# Patient Record
Sex: Female | Born: 1955 | Race: White | Hispanic: No | Marital: Married | Smoking: Never smoker
Health system: Southern US, Community
[De-identification: ages and names within clinical notes are randomized; demographics above are authoritative.]

## PROBLEM LIST (undated history)

## (undated) HISTORY — PX: TOTAL LAPAROSCOPIC HYSTERECTOMY WITH SALPINGECTOMY: SHX6742

---
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
## 1981-05-19 HISTORY — PX: OVARIAN CYST REMOVAL: SHX89

## 1989-11-19 HISTORY — PX: BREAST ENHANCEMENT SURGERY: SHX7

## 2013-06-16 ENCOUNTER — Ambulatory Visit: Payer: Self-pay | Admitting: Sports Medicine

## 2013-06-22 ENCOUNTER — Encounter: Payer: Self-pay | Admitting: Sports Medicine

## 2013-06-22 ENCOUNTER — Ambulatory Visit (INDEPENDENT_AMBULATORY_CARE_PROVIDER_SITE_OTHER): Payer: BC Managed Care – PPO

## 2013-06-22 ENCOUNTER — Ambulatory Visit (INDEPENDENT_AMBULATORY_CARE_PROVIDER_SITE_OTHER): Payer: BC Managed Care – PPO | Admitting: Sports Medicine

## 2013-06-22 VITALS — BP 118/72 | HR 98 | Wt 137.0 lb

## 2013-06-22 DIAGNOSIS — G5600 Carpal tunnel syndrome, unspecified upper limb: Secondary | ICD-10-CM

## 2013-06-22 DIAGNOSIS — M5412 Radiculopathy, cervical region: Secondary | ICD-10-CM

## 2013-06-22 DIAGNOSIS — G5601 Carpal tunnel syndrome, right upper limb: Secondary | ICD-10-CM | POA: Insufficient documentation

## 2013-06-22 MED ORDER — DEXAMETHASONE 2 MG PO TABS: 2.0000 mg | ORAL_TABLET | Freq: Two times a day (BID) | ORAL | Status: DC

## 2013-06-22 MED ORDER — MELOXICAM 15 MG PO TABS: ORAL_TABLET | ORAL | Status: DC

## 2013-06-22 NOTE — Assessment & Plan Note (Signed)
Median nerve hydrodissection as above. Continue night splints, home exercises. Mobic.

## 2013-06-22 NOTE — Progress Notes (Signed)
   Subjective:    I'm seeing this patient as a consultation for:  Dr. Lelon Perla  CC: Wrist Pain  HPI: Kathleen Reilly is a pleasant 57 year old female who presents with complaint of right wrist pain. The patient states that she has experienced some paresthesias and pain in her right wrist for a couple of years. She noticed the pain more in the morning upon waking and anytime she did her hair or had her hands above her heart for extended periods of time. She states that this pain has been intermittent but that 3 weeks ago, after she painted a room, the pain increased. She states that the pain is 7/10 and that can arise without movement of the wrist. The pain can either be electrical or so numbing that it hurts. The pain keeps her up at night and "prevents her from doing things she needs to do." She has tried night splinting but states that it caused so much pain that she stopped. She endorses chronic neck pain.  Past medical history, Surgical history, Family history not pertinant except as noted below, Social history, Allergies, and medications have been entered into the medical record, reviewed, and no changes needed.   Review of Systems: No headache, visual changes, nausea, vomiting, diarrhea, constipation, dizziness, abdominal pain, skin rash, fevers, chills, night sweats, weight loss, swollen lymph nodes, body aches, joint swelling, muscle aches, chest pain, shortness of breath, mood changes, visual or auditory hallucinations.   Objective:   General: Well Developed, well nourished, and in no acute distress.  Neuro/Psych: Alert and oriented x3, extra-ocular muscles intact, able to move all 4 extremities, sensation grossly intact. Skin: Warm and dry, no rashes noted.  Respiratory: Not using accessory muscles, speaking in full sentences, trachea midline.  Cardiovascular: Pulses palpable, no extremity edema. Abdomen: Does not appear distended. Right wrist: Positive Phalen's test, thenar tenderness to  palpation. Tinel's test elicits paresthesias in fingers 2, 3, and 4. No thenar atrophy or hyperthenar atrophy. Neck:  Positive Spurling's test, reproduces pain radiating down the right arm in a C8 distribution.  X-rays were personally reviewed and shows C5-C6 as well as C6-C7 degenerative disc disease with multiple osteophytes and some evidence of foraminal osteophytosis.  Procedure: Real-time Ultrasound Guided injection/hydrodissection of right median nerve Device: GE Logiq E  Verbal informed consent obtained.  Time-out conducted.  Noted no overlying erythema, induration, or other signs of local infection.  Skin prepped in a sterile fashion.  Local anesthesia: Topical Ethyl chloride.  With sterile technique and under real time ultrasound guidance:  Needle advanced near the nerve, a total of 1 cc Kenalog 40, 4 cc lidocaine injected both superficial and deep to the median nerve slowly peeling it off of surrounding structures. Completed without difficulty  Pain immediately resolved suggesting accurate placement of the medication.  Advised to call if fevers/chills, erythema, induration, drainage, or persistent bleeding.  Images permanently stored and available for review in the ultrasound unit.  Impression: Technically successful ultrasound guided median nerve injection/hydrodissection.  Impression and Recommendations:   This case required medical decision making of moderate complexity.  Carpal Tunnel Syndrome and Cervical Spine Radiculitis Median nerve injection Obtaining cervical spine x-ray Prescribing prednisone, mobic, home exercises Follow up in 1 month

## 2013-06-22 NOTE — Assessment & Plan Note (Signed)
I think that her radicular symptoms represent a separate issue from the carpal tunnel syndrome, she describes more of a C8 versus C7 radiculitis. Decadron, x-rays, home exercises. Return in 4 weeks.

## 2013-07-23 ENCOUNTER — Telehealth: Payer: Self-pay | Admitting: *Deleted

## 2013-07-23 ENCOUNTER — Ambulatory Visit (INDEPENDENT_AMBULATORY_CARE_PROVIDER_SITE_OTHER): Payer: BC Managed Care – PPO | Admitting: Sports Medicine

## 2013-07-23 ENCOUNTER — Encounter: Payer: Self-pay | Admitting: Sports Medicine

## 2013-07-23 VITALS — BP 116/78 | HR 88 | Wt 138.0 lb

## 2013-07-23 DIAGNOSIS — G5601 Carpal tunnel syndrome, right upper limb: Secondary | ICD-10-CM

## 2013-07-23 DIAGNOSIS — M5412 Radiculopathy, cervical region: Secondary | ICD-10-CM

## 2013-07-23 MED ORDER — DIAZEPAM 5 MG PO TABS: ORAL_TABLET | ORAL | Status: DC

## 2013-07-23 NOTE — Assessment & Plan Note (Signed)
Symptoms are completely resolved after median nerve hydrodissection.

## 2013-07-23 NOTE — Progress Notes (Signed)
  Subjective:    CC: Follow up  HPI: Carpal tunnel syndrome: Status post median nerve hydrodissection at the last visit on the right side, she is now completely pain and numbness free.  Cervical radiculopathy, right side: Partial response to Decadron and home exercises. She does desire to pursue further interventional treatment. She continues to have pain that runs down to her fourth and fifth fingers.  Past medical history, Surgical history, Family history not pertinant except as noted below, Social history, Allergies, and medications have been entered into the medical record, reviewed, and no changes needed.   Review of Systems: No fevers, chills, night sweats, weight loss, chest pain, or shortness of breath.   Objective:    General: Well Developed, well nourished, and in no acute distress.  Neuro: Alert and oriented x3, extra-ocular muscles intact, sensation grossly intact.  HEENT: Normocephalic, atraumatic, pupils equal round reactive to light, neck supple, no masses, no lymphadenopathy, thyroid nonpalpable.  Skin: Warm and dry, no rashes. Cardiac: Regular rate and rhythm, no murmurs rubs or gallops, no lower extremity edema.  Respiratory: Clear to auscultation bilaterally. Not using accessory muscles, speaking in full sentences.  Impression and Recommendations:

## 2013-07-23 NOTE — Telephone Encounter (Signed)
Prior auth obtained for MRI Cervical Spine w/o contrast through [facility] online.  Auth # [id number].  [facility] notified. Barry Dienes, LPN

## 2013-07-23 NOTE — Assessment & Plan Note (Signed)
Symptoms continue, and they likely represent a C8 radiculopathy. X-rays do show multilevel degenerative disease, at this point she has failed conservative measures and I am going to obtain an MRI for interventional injection planning. She will need Valium for preprocedural anxiolysis.

## 2013-08-12 ENCOUNTER — Telehealth: Payer: Self-pay | Admitting: *Deleted

## 2013-08-12 NOTE — Telephone Encounter (Signed)
Myriam Jacobson from imaging called and states pt called and said she was not going to have the MRI due to financial reasons.  Meyer Cory, LPN

## 2013-09-29 ENCOUNTER — Encounter: Payer: Self-pay | Admitting: Sports Medicine

## 2013-09-29 ENCOUNTER — Ambulatory Visit (INDEPENDENT_AMBULATORY_CARE_PROVIDER_SITE_OTHER): Payer: BC Managed Care – PPO | Admitting: Sports Medicine

## 2013-09-29 VITALS — BP 118/72 | HR 100 | Wt 145.0 lb

## 2013-09-29 DIAGNOSIS — M5412 Radiculopathy, cervical region: Secondary | ICD-10-CM

## 2013-09-29 DIAGNOSIS — G5600 Carpal tunnel syndrome, unspecified upper limb: Secondary | ICD-10-CM

## 2013-09-29 DIAGNOSIS — G5601 Carpal tunnel syndrome, right upper limb: Secondary | ICD-10-CM

## 2013-09-29 NOTE — Assessment & Plan Note (Signed)
Injection performed today. Night splints. Return as needed, we can do this safely up to 4 times a year/every 3 months.

## 2013-09-29 NOTE — Progress Notes (Addendum)
  Subjective:    CC: Followup  HPI: Right carpal tunnel syndrome: Injected approximately 3 months ago, excellent response in the last week. She desires repeat interventional treatment. Worsening. Gets numbness and tingling into her first and second fingers, pain into her forearm.  Cervical radiculopathy: at this point cannot afford the deductible for an MRI, she will let us know when she is ready to proceed with this.  Past medical history, Surgical history, Family history not pertinant except as noted below, Social history, Allergies, and medications have been entered into the medical record, reviewed, and no changes needed.   Review of Systems: No fevers, chills, night sweats, weight loss, chest pain, or shortness of breath.   Objective:    General: Well Developed, well nourished, and in no acute distress.  Neuro: Alert and oriented x3, extra-ocular muscles intact, sensation grossly intact.  HEENT: Normocephalic, atraumatic, pupils equal round reactive to light, neck supple, no masses, no lymphadenopathy, thyroid nonpalpable.  Skin: Warm and dry, no rashes. Cardiac: Regular rate and rhythm, no murmurs rubs or gallops, no lower extremity edema.  Respiratory: Clear to auscultation bilaterally. Not using accessory muscles, speaking in full sentences. Right Wrist: Inspection normal with no visible erythema or swelling. ROM smooth and normal with good flexion and extension and ulnar/radial deviation that is symmetrical with opposite wrist. Palpation is normal over metacarpals, navicular, lunate, and TFCC; tendons without tenderness/ swelling No snuffbox tenderness. No tenderness over Canal of Guyon. Strength 5/5 in all directions without pain. Negative Finkelstein sign, positive Tinel's, positive Phalen signs. Negative Watson's test.  Procedure: Real-time Ultrasound Guided injection/Hydrodissection of right carpal tunnel and median nerve. Device: GE Logiq E  Verbal informed consent  obtained.  Time-out conducted.  Noted no overlying erythema, induration, or other signs of local infection.  Skin prepped in a sterile fashion.  Local anesthesia: Topical Ethyl chloride.  With sterile technique and under real time ultrasound guidance:  25-gauge needle advanced into the carpal tunnel taking care to avoid all vascular structures, a total of 1 cc Kenalog 40, 3 cc lidocaine was used to hydrodissect the median nerve surrounding structures. Completed without difficulty  Pain immediately resolved suggesting accurate placement of the medication.  Advised to call if fevers/chills, erythema, induration, drainage, or persistent bleeding.  Images permanently stored and available for review in the ultrasound unit.  Impression: Technically successful ultrasound guided injection.  Impression and Recommendations:

## 2013-09-29 NOTE — Assessment & Plan Note (Signed)
We can keep this on the back burner for now. Next would be an MRI for interventional injection planning.

## 2014-04-30 ENCOUNTER — Ambulatory Visit (INDEPENDENT_AMBULATORY_CARE_PROVIDER_SITE_OTHER): Payer: BC Managed Care – PPO | Admitting: Sports Medicine

## 2014-04-30 ENCOUNTER — Encounter: Payer: Self-pay | Admitting: Sports Medicine

## 2014-04-30 VITALS — BP 119/74 | HR 69 | Ht 63.0 in | Wt 141.0 lb

## 2014-04-30 DIAGNOSIS — G5600 Carpal tunnel syndrome, unspecified upper limb: Secondary | ICD-10-CM

## 2014-04-30 DIAGNOSIS — G5601 Carpal tunnel syndrome, right upper limb: Secondary | ICD-10-CM

## 2014-04-30 NOTE — Assessment & Plan Note (Signed)
Seven-month response to the last median nerve hydrodissection. Repeat injection/hydrodissection today. Return as needed.

## 2014-04-30 NOTE — Progress Notes (Signed)
  Subjective:    CC: Followup  HPI: Right carpal tunnel syndrome: Solid seven-month response to the last median nerve hydrodissection She desires repeat interventional treatment. Worsening. Gets numbness and tingling into her first and second fingers, pain into her forearm.  Past medical history, Surgical history, Family history not pertinant except as noted below, Social history, Allergies, and medications have been entered into the medical record, reviewed, and no changes needed.   Review of Systems: No fevers, chills, night sweats, weight loss, chest pain, or shortness of breath.   Objective:    General: Well Developed, well nourished, and in no acute distress.  Neuro: Alert and oriented x3, extra-ocular muscles intact, sensation grossly intact.  HEENT: Normocephalic, atraumatic, pupils equal round reactive to light, neck supple, no masses, no lymphadenopathy, thyroid nonpalpable.  Skin: Warm and dry, no rashes. Cardiac: Regular rate and rhythm, no murmurs rubs or gallops, no lower extremity edema.  Respiratory: Clear to auscultation bilaterally. Not using accessory muscles, speaking in full sentences. Right Wrist: Inspection normal with no visible erythema or swelling. ROM smooth and normal with good flexion and extension and ulnar/radial deviation that is symmetrical with opposite wrist. Palpation is normal over metacarpals, navicular, lunate, and TFCC; tendons without tenderness/ swelling No snuffbox tenderness. No tenderness over Canal of Guyon. Strength 5/5 in all directions without pain. Negative Finkelstein sign, positive Tinel's, positive Phalen signs. Negative Watson's test.  Procedure: Real-time Ultrasound Guided injection/Hydrodissection of right carpal tunnel and median nerve. Device: GE Logiq E  Verbal informed consent obtained.  Time-out conducted.  Noted no overlying erythema, induration, or other signs of local infection.  Skin prepped in a sterile fashion.    Local anesthesia: Topical Ethyl chloride.  With sterile technique and under real time ultrasound guidance:  25-gauge needle advanced into the carpal tunnel taking care to avoid all vascular structures, a total of 1 cc Kenalog 40, 3 cc lidocaine was used to hydrodissect the median nerve surrounding structures. Completed without difficulty  Pain immediately resolved suggesting accurate placement of the medication.  Advised to call if fevers/chills, erythema, induration, drainage, or persistent bleeding.  Images permanently stored and available for review in the ultrasound unit.  Impression: Technically successful ultrasound guided injection.  Impression and Recommendations:

## 2014-09-29 ENCOUNTER — Ambulatory Visit (INDEPENDENT_AMBULATORY_CARE_PROVIDER_SITE_OTHER): Payer: BC Managed Care – PPO | Admitting: Sports Medicine

## 2014-09-29 ENCOUNTER — Encounter: Payer: Self-pay | Admitting: Sports Medicine

## 2014-09-29 VITALS — BP 110/69 | HR 75 | Ht 63.0 in | Wt 138.0 lb

## 2014-09-29 DIAGNOSIS — G5601 Carpal tunnel syndrome, right upper limb: Secondary | ICD-10-CM | POA: Diagnosis not present

## 2014-09-29 NOTE — Assessment & Plan Note (Signed)
Five-month response to the last median nerve hydrodissection. Repeat median nerve hydrodissection today. Return as needed. Continue extension splinting at nighttime.

## 2014-09-29 NOTE — Progress Notes (Signed)
  Subjective:    CC:  Carpal tunnel syndrome  HPI: Right carpal tunnel syndrome: Five-month response to the last median nerve hydrodissection. His eyes a repeat, pain is moderate, persistent into the hands and median nerve distribution.  Past medical history, Surgical history, Family history not pertinant except as noted below, Social history, Allergies, and medications have been entered into the medical record, reviewed, and no changes needed.   Review of Systems: No fevers, chills, night sweats, weight loss, chest pain, or shortness of breath.   Objective:    General: Well Developed, well nourished, and in no acute distress.  Neuro: Alert and oriented x3, extra-ocular muscles intact, sensation grossly intact.  HEENT: Normocephalic, atraumatic, pupils equal round reactive to light, neck supple, no masses, no lymphadenopathy, thyroid nonpalpable.  Skin: Warm and dry, no rashes. Cardiac: Regular rate and rhythm, no murmurs rubs or gallops, no lower extremity edema.  Respiratory: Clear to auscultation bilaterally. Not using accessory muscles, speaking in full sentences.  Procedure: Real-time Ultrasound Guided injection/Hydrodissection of right carpal tunnel and median nerve. Device: GE Logiq E  Verbal informed consent obtained.  Time-out conducted.  Noted no overlying erythema, induration, or other signs of local infection.  Skin prepped in a sterile fashion.  Local anesthesia: Topical Ethyl chloride.  With sterile technique and under real time ultrasound guidance:  25-gauge needle advanced into the carpal tunnel taking care to avoid all vascular structures, a total of 1 cc Kenalog 40, 3 cc lidocaine was used to hydrodissect the median nerve surrounding structures. Completed without difficulty  Pain immediately resolved suggesting accurate placement of the medication.  Advised to call if fevers/chills, erythema, induration, drainage, or persistent bleeding.  Images permanently stored and  available for review in the ultrasound unit.  Impression: Technically successful ultrasound guided injection.  Impression and Recommendations:

## 2015-02-04 IMAGING — CR DG CERVICAL SPINE COMPLETE 4+V
7 series · 7 of 7 positions shown · non-contrast
Comparison: None.

CLINICAL DATA: Diffuse neck pain, tingling in the right arm, no
injury

CERVICAL SPINE - COMPLETE 4+ VIEW

[view not recorded (1 of 7)]
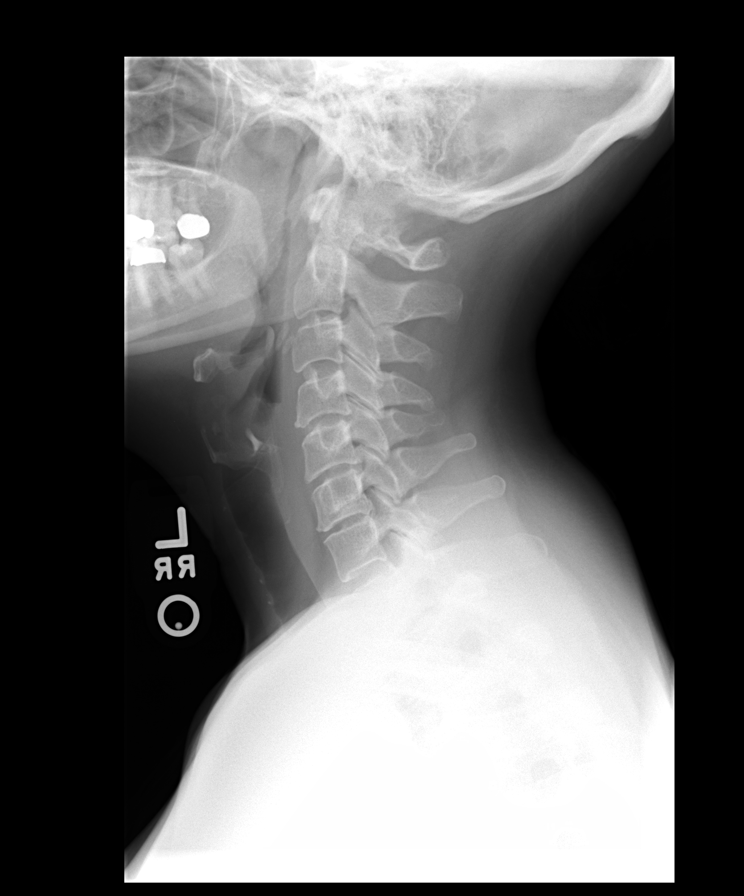

[view not recorded (2 of 7)]
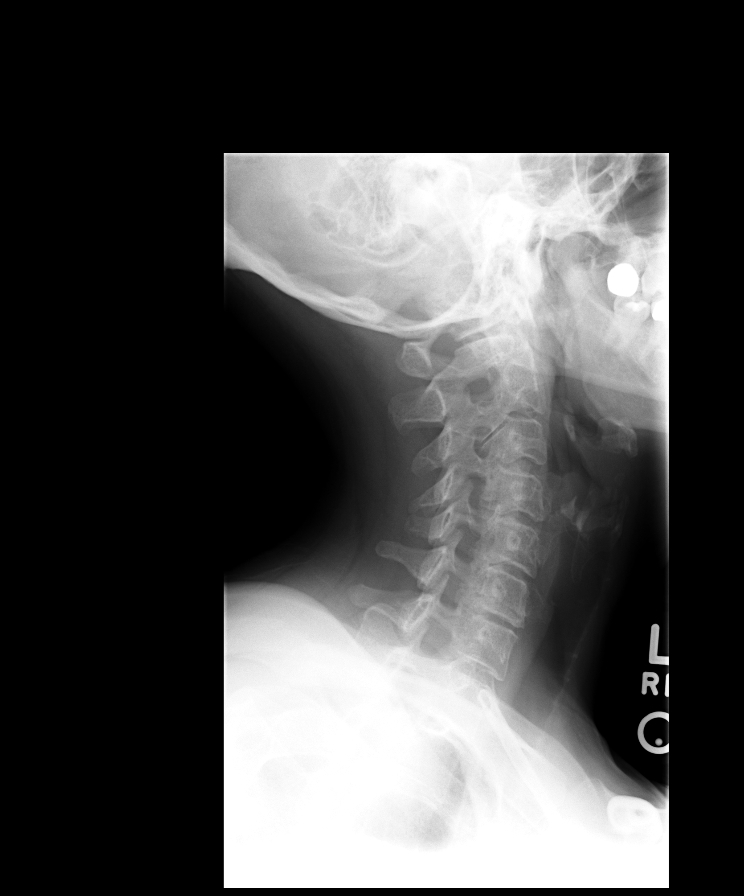

[view not recorded (3 of 7)]
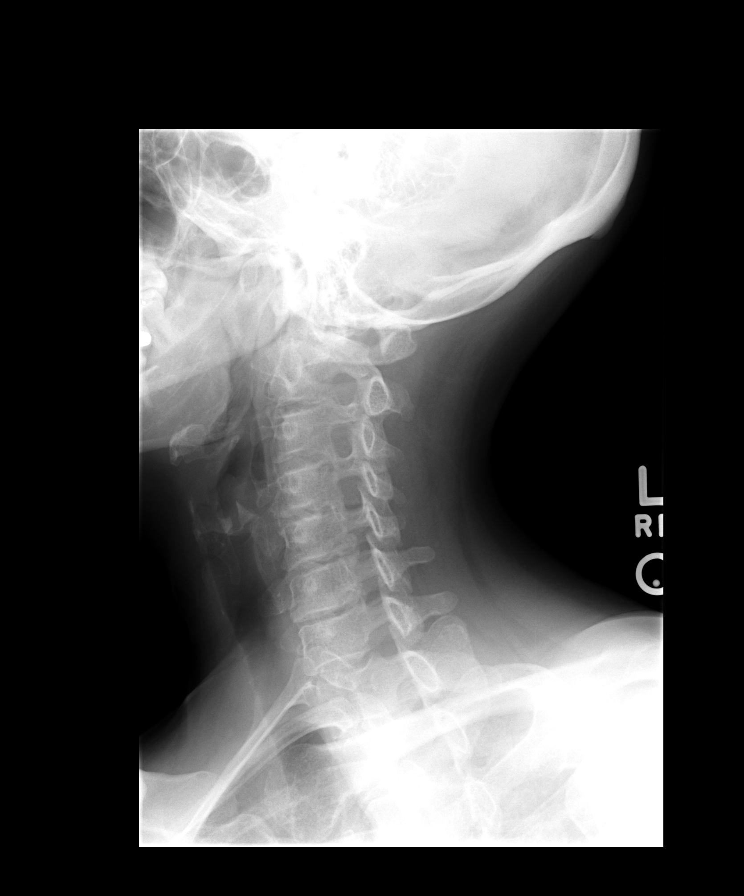

[view not recorded (4 of 7)]
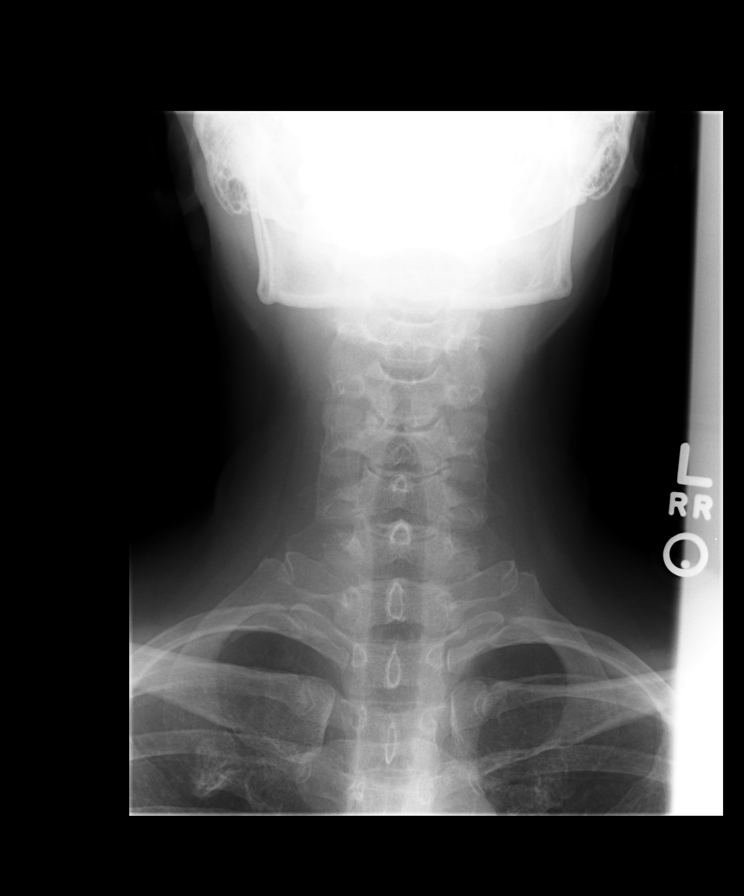

[view not recorded (5 of 7)]
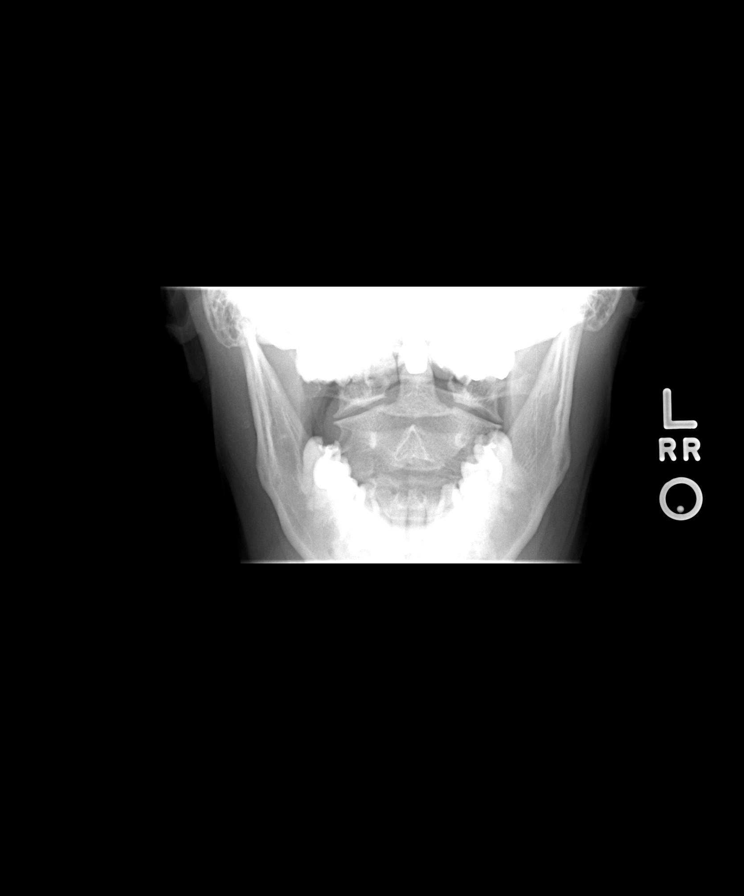

[view not recorded (6 of 7)]
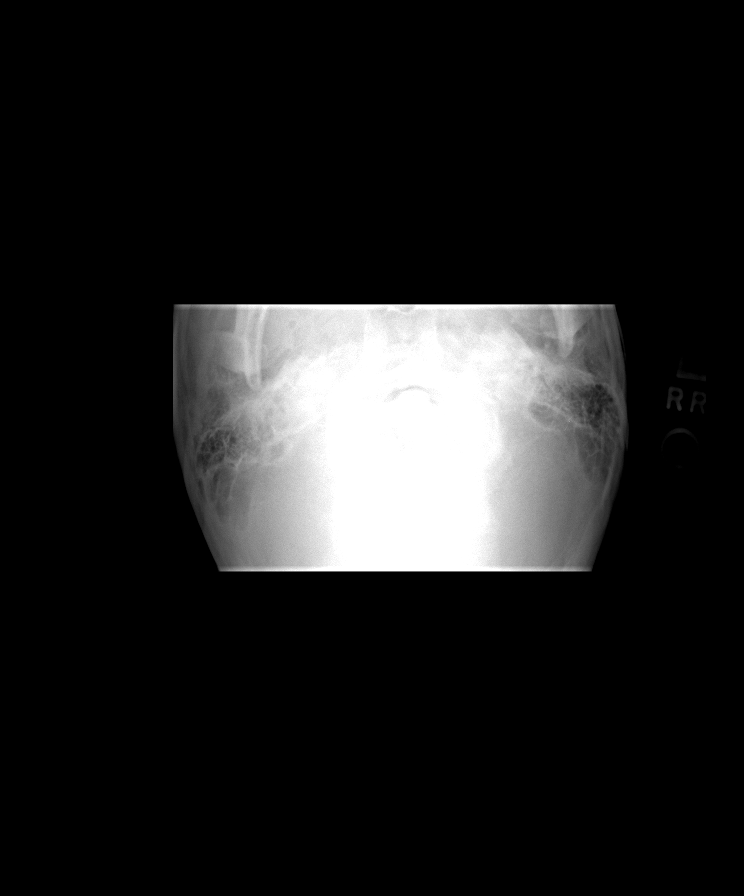

[view not recorded (7 of 7)]
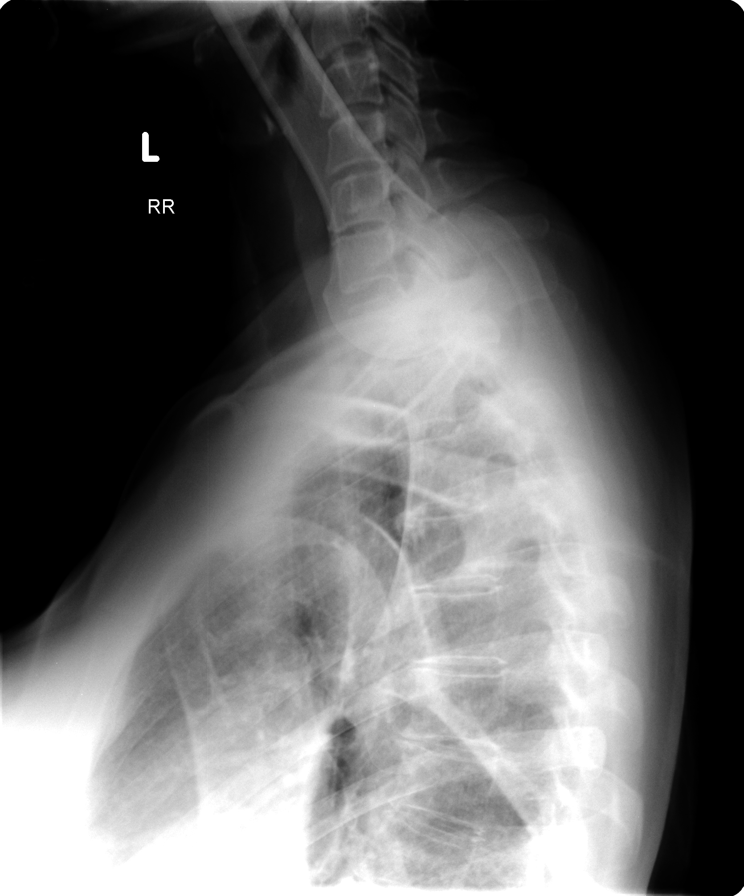

[7 of 7 positions shown; findings below may reference images not displayed]

FINDINGS: There is mild degenerative disc disease at C4-5, C5-6,
and C6-7 with some loss of intervertebral disc space and mild
spurring.  No prevertebral soft tissue swelling is seen.  There is
moderate foraminal narrowing bilaterally at C5-6 and C6-7.  The
odontoid process is intact.  The lung apices are clear.
IMPRESSION: Normal alignment with degenerative disc disease at C4-5, C5-6, and
C6-7 with moderate foraminal narrowing bilaterally at C5-6 and C6-
7.

## 2015-02-22 ENCOUNTER — Encounter: Payer: Self-pay | Admitting: Sports Medicine

## 2015-02-22 ENCOUNTER — Ambulatory Visit (INDEPENDENT_AMBULATORY_CARE_PROVIDER_SITE_OTHER): Payer: BC Managed Care – PPO | Admitting: Sports Medicine

## 2015-02-22 VITALS — BP 135/84 | HR 75 | Wt 140.0 lb

## 2015-02-22 DIAGNOSIS — G5601 Carpal tunnel syndrome, right upper limb: Secondary | ICD-10-CM

## 2015-02-22 DIAGNOSIS — M5412 Radiculopathy, cervical region: Secondary | ICD-10-CM | POA: Diagnosis not present

## 2015-02-22 NOTE — Assessment & Plan Note (Signed)
Third median nerve hydrodissection of the right for carpal tunnel syndrome. 5 months response again since the previous injection, and pain has not even come back full-fledged. Continue extension splint at night. Median nerve hydrodissection today. Return as needed for this.

## 2015-02-22 NOTE — Assessment & Plan Note (Signed)
Patient is not yet ready to proceed with MRI, she does have some left TMJ type symptoms I have asked her to discuss with her oral surgeon for consideration of injection. She does endorse some numbness and tingling coming across the left side of her face with throbbing pain, this may represent trigeminal neuralgia, TMJ, or a more central lesion of the trigeminal nerve. I have advised an MRI of the brain, she is going to think about this and let me know, we can do it at the same time as her cervical spine MRI.

## 2015-02-22 NOTE — Progress Notes (Addendum)
  Subjective:    CC: Right hand numbness  HPI: This is a pleasant 59 year old female, we have been treating her for right-sided carpal tunnel syndrome, she did not have complete relief at nighttime extension splinting, and has done extremely well with ultrasound-guided percutaneous median nerve hydrodissection's. She had a five-month response to her previous injection desires repeat today, symptoms are moderate, persistent.  Left sided facial pain: With numbness and tingling, it occasional popping sensations at the TMJ. No headaches, visual changes, nausea.  Past medical history, Surgical history, Family history not pertinant except as noted below, Social history, Allergies, and medications have been entered into the medical record, reviewed, and no changes needed.   Review of Systems: No fevers, chills, night sweats, weight loss, chest pain, or shortness of breath.   Objective:    General: Well Developed, well nourished, and in no acute distress.  Neuro: Alert and oriented x3, extra-ocular muscles intact, sensation grossly intact.  HEENT: Normocephalic, atraumatic, pupils equal round reactive to light, neck supple, no masses, no lymphadenopathy, thyroid nonpalpable.  Skin: Warm and dry, no rashes. Cardiac: Regular rate and rhythm, no murmurs rubs or gallops, no lower extremity edema.  Respiratory: Clear to auscultation bilaterally. Not using accessory muscles, speaking in full sentences.  Procedure: Real-time Ultrasound Guided right carpal tunnel/median nerve hydrodissection Device: GE Logiq E  Verbal informed consent obtained.  Time-out conducted.  Noted no overlying erythema, induration, or other signs of local infection.  Skin prepped in a sterile fashion.  Local anesthesia: Topical Ethyl chloride.  With sterile technique and under real time ultrasound guidance:  Using a 25-gauge needle I injected a total of 1 mL kenalog 40, 4 mL lidocaine both superficial to and deep to the median  nerve in the carpal tunnel freeing it from surrounding structures, the needle was also redirected and some medication was injected deep into the carpal tunnel around the flexor tendons. We took care to avoid intraneural injection or trauma to the nerve itself. Completed without difficulty  Pain immediately resolved suggesting accurate placement of the medication.  Advised to call if fevers/chills, erythema, induration, drainage, or persistent bleeding.  Images permanently stored and available for review in the ultrasound unit.  Impression: Technically successful ultrasound guided injection.  Impression and Recommendations:

## 2015-06-27 ENCOUNTER — Encounter: Payer: Self-pay | Admitting: Sports Medicine

## 2015-06-27 ENCOUNTER — Ambulatory Visit (INDEPENDENT_AMBULATORY_CARE_PROVIDER_SITE_OTHER): Payer: BC Managed Care – PPO | Admitting: Sports Medicine

## 2015-06-27 VITALS — BP 108/69 | HR 73 | Wt >= 6400 oz

## 2015-06-27 DIAGNOSIS — G5601 Carpal tunnel syndrome, right upper limb: Secondary | ICD-10-CM

## 2015-06-27 NOTE — Progress Notes (Signed)
  Subjective:    CC: Carpal tunnel syndrome  HPI: This is a very pleasant 59 year old phenol, I'm seeing her approximately 4 months post her previous median nerve hydrodissection of the right side, she gets a four-month to five-month response each time and desires repeat interventional treatment today, symptoms are moderate, persistent with anesthesias in the median nerve distribution in the hand as well as soreness into the hand and forearm as well as the wrist particularly at night.  Past medical history, Surgical history, Family history not pertinant except as noted below, Social history, Allergies, and medications have been entered into the medical record, reviewed, and no changes needed.   Review of Systems: No fevers, chills, night sweats, weight loss, chest pain, or shortness of breath.   Objective:    General: Well Developed, well nourished, and in no acute distress.  Neuro: Alert and oriented x3, extra-ocular muscles intact, sensation grossly intact.  HEENT: Normocephalic, atraumatic, pupils equal round reactive to light, neck supple, no masses, no lymphadenopathy, thyroid nonpalpable.  Skin: Warm and dry, no rashes. Cardiac: Regular rate and rhythm, no murmurs rubs or gallops, no lower extremity edema.  Respiratory: Clear to auscultation bilaterally. Not using accessory muscles, speaking in full sentences. Right Wrist: Inspection normal with no visible erythema or swelling. ROM smooth and normal with good flexion and extension and ulnar/radial deviation that is symmetrical with opposite wrist. Palpation is normal over metacarpals, navicular, lunate, and TFCC; tendons without tenderness/ swelling No snuffbox tenderness. No tenderness over Canal of Guyon. Strength 5/5 in all directions without pain. Negative Finkelstein sign, positive Tinel's and Phalen signs. Negative Watson's test.  Procedure: Real-time Ultrasound Guided hydrodissection of right median nerve at the carpal  tunnel Device: GE Logiq E  Verbal informed consent obtained.  Time-out conducted.  Noted no overlying erythema, induration, or other signs of local infection.  Skin prepped in a sterile fashion.  Local anesthesia: Topical Ethyl chloride.  With sterile technique and under real time ultrasound guidance:  Using a 25-gauge needle medication was injected both superficial to and deep to the median nerve frequency running structures, the needle was then redirected and medication was then placed deep to the carpal tunnel around the 9 flexor tendons, a total of 1 mL kenalog 40, 4 mL lidocaine was used. Completed without difficulty  Pain immediately resolved suggesting accurate placement of the medication.  Advised to call if fevers/chills, erythema, induration, drainage, or persistent bleeding.  Images permanently stored and available for review in the ultrasound unit.  Impression: Technically successful ultrasound guided injection.  Impression and Recommendations:

## 2015-06-27 NOTE — Assessment & Plan Note (Signed)
Doing extremely well with occasional median nerve hydrodissection. Previous median nerve hydrodissection was 4 months ago, repeating the right side. Return as needed.

## 2016-11-06 ENCOUNTER — Encounter: Payer: Self-pay | Admitting: Sports Medicine

## 2016-11-09 ENCOUNTER — Ambulatory Visit: Payer: Self-pay | Admitting: Sports Medicine

## 2016-11-15 ENCOUNTER — Ambulatory Visit: Payer: BC Managed Care – PPO | Admitting: Sports Medicine

## 2016-12-28 ENCOUNTER — Encounter: Payer: Self-pay | Admitting: Sports Medicine

## 2023-08-05 ENCOUNTER — Other Ambulatory Visit: Payer: Self-pay | Admitting: Obstetrics

## 2023-08-05 ENCOUNTER — Ambulatory Visit: Payer: Medicare PPO | Admitting: Obstetrics

## 2023-08-05 ENCOUNTER — Encounter: Payer: Self-pay | Admitting: Obstetrics

## 2023-08-05 VITALS — BP 149/83 | HR 77 | Ht 63.25 in | Wt 144.0 lb

## 2023-08-05 DIAGNOSIS — R350 Frequency of micturition: Secondary | ICD-10-CM | POA: Diagnosis not present

## 2023-08-05 DIAGNOSIS — N952 Postmenopausal atrophic vaginitis: Secondary | ICD-10-CM

## 2023-08-05 DIAGNOSIS — N3946 Mixed incontinence: Secondary | ICD-10-CM | POA: Diagnosis not present

## 2023-08-05 DIAGNOSIS — R159 Full incontinence of feces: Secondary | ICD-10-CM | POA: Diagnosis not present

## 2023-08-05 DIAGNOSIS — N8110 Cystocele, unspecified: Secondary | ICD-10-CM

## 2023-08-05 LAB — POCT URINALYSIS DIPSTICK
Bilirubin, UA: NEGATIVE
Blood, UA: NEGATIVE
Glucose, UA: NEGATIVE
Ketones, UA: NEGATIVE
Leukocytes, UA: NEGATIVE
Nitrite, UA: NEGATIVE
Protein, UA: NEGATIVE
Spec Grav, UA: 1.015 (ref 1.010–1.025)
Urobilinogen, UA: 0.2 U/dL
pH, UA: 6.5 (ref 5.0–8.0)

## 2023-08-05 MED ORDER — GEMTESA 75 MG PO TABS
75.0000 mg | ORAL_TABLET | Freq: Every day | ORAL | Status: DC
Start: 2023-08-05 — End: 2024-05-18

## 2023-08-05 MED ORDER — GEMTESA 75 MG PO TABS
75.0000 mg | ORAL_TABLET | Freq: Every day | ORAL | 2 refills | Status: DC
Start: 2023-08-05 — End: 2024-05-18

## 2023-08-05 NOTE — Patient Instructions (Signed)
For treatment of stress urinary incontinence,  non-surgical options include expectant management, weight loss, physical therapy, as well as a pessary.  Surgical options include a midurethral sling, Burch urethropexy, and transurethral injection of a bulking agent.  We discussed the symptoms of overactive bladder (OAB), which include urinary urgency, urinary frequency, night-time urination, with or without urge incontinence.  We discussed management including behavioral therapy (decreasing bladder irritants by following a bladder diet, urge suppression strategies, timed voids, bladder retraining), physical therapy, medication; and for refractory cases posterior tibial nerve stimulation, sacral neuromodulation, and intravesical botulinum toxin injection.   For Beta-3 agonist medication, we discussed the potential side effect of elevated blood pressure which is more likely to occur in individuals with uncontrolled hypertension. You were given gemtesa 75mg  daily.  It can take a month to start working so give it time, but if you have bothersome side effects call sooner and we can try a different medication.  Call us if you have trouble filling the prescription or if it's not covered by your insurance.  Women should try to eat at least 21 to 25 grams of fiber a day, while men should aim for 30 to 38 grams a day. You can add fiber to your diet with food or a fiber supplement such as psyllium (metamucil), benefiber, or fibercon.   Here's a look at how much dietary fiber is found in some common foods. When buying packaged foods, check the Nutrition Facts label for fiber content. It can vary among brands.  Fruits Serving size Total fiber (grams)*  Raspberries 1 cup 8.0  Pear 1 medium 5.5  Apple, with skin 1 medium 4.5  Banana 1 medium 3.0  Orange 1 medium 3.0  Strawberries 1 cup 3.0   Vegetables Serving size Total fiber (grams)*  Green peas, boiled 1 cup 9.0  Broccoli, boiled 1 cup chopped 5.0  Turnip  greens, boiled 1 cup 5.0  Brussels sprouts, boiled 1 cup 4.0  Potato, with skin, baked 1 medium 4.0  Sweet corn, boiled 1 cup 3.5  Cauliflower, raw 1 cup chopped 2.0  Carrot, raw 1 medium 1.5   Grains Serving size Total fiber (grams)*  Spaghetti, whole-wheat, cooked 1 cup 6.0  Barley, pearled, cooked 1 cup 6.0  Bran flakes 3/4 cup 5.5  Quinoa, cooked 1 cup 5.0  Oat bran muffin 1 medium 5.0  Oatmeal, instant, cooked 1 cup 5.0  Popcorn, air-popped 3 cups 3.5  Brown rice, cooked 1 cup 3.5  Bread, whole-wheat 1 slice 2.0  Bread, rye 1 slice 2.0   Legumes, nuts and seeds Serving size Total fiber (grams)*  Split peas, boiled 1 cup 16.0  Lentils, boiled 1 cup 15.5  Black beans, boiled 1 cup 15.0  Baked beans, canned 1 cup 10.0  Chia seeds 1 ounce 10.0  Almonds 1 ounce (23 nuts) 3.5  Pistachios 1 ounce (49 nuts) 3.0  Sunflower kernels 1 ounce 3.0  *Rounded to nearest 0.5 gram. Source: [employer] for Standard Reference, Legacy Release    Follow-up in 3 months to see how your symptoms are responding to treatment.
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
## 2023-09-06 NOTE — Telephone Encounter (Signed)
Patient's medication was approved. Copayment is $64.00

## 2023-09-16 ENCOUNTER — Ambulatory Visit: Payer: Medicare PPO | Admitting: Obstetrics

## 2023-10-02 ENCOUNTER — Ambulatory Visit: Payer: Medicare PPO | Admitting: Obstetrics

## 2024-03-26 ENCOUNTER — Encounter: Payer: Self-pay | Admitting: Obstetrics and Gynecology

## 2024-03-26 ENCOUNTER — Ambulatory Visit: Admitting: Obstetrics and Gynecology

## 2024-03-26 VITALS — BP 112/74 | HR 67

## 2024-03-26 DIAGNOSIS — N8110 Cystocele, unspecified: Secondary | ICD-10-CM

## 2024-03-26 DIAGNOSIS — N814 Uterovaginal prolapse, unspecified: Secondary | ICD-10-CM | POA: Diagnosis not present

## 2024-03-26 DIAGNOSIS — N393 Stress incontinence (female) (male): Secondary | ICD-10-CM

## 2024-03-26 DIAGNOSIS — N3946 Mixed incontinence: Secondary | ICD-10-CM

## 2024-03-26 MED ORDER — ESTRADIOL 0.1 MG/GM VA CREA: 0.5000 g | TOPICAL_CREAM | VAGINAL | 11 refills | Status: AC

## 2024-03-26 NOTE — Progress Notes (Signed)
 [facility]   Subjective:     Chief Complaint: Pessary fitting Kathleen Reilly is a 68 y.o. female is here for pessary fitting.)  History of Present Illness: Kathleen Reilly is a 68 y.o. female with stage I pelvic organ prolapse and stress incontinence who presents today for a pessary fitting.    Past Medical History: Patient  has no past medical history on file.   Past Surgical History: She  has a past surgical history that includes Breast enhancement surgery (11/19/1989); Appendectomy (11/20/1963); Ovarian cyst removal (05/19/1981); and Total laparoscopic hysterectomy with salpingectomy.   Medications: She has a current medication list which includes the following prescription(s): bupropion, estradiol, ivermectin, magnesium, naltrexone hcl, np thyroid, ondansetron, bupropion, gemtesa , and gemtesa .   Allergies: Patient is allergic to penicillins.   Social History: Patient  reports that she has never smoked. She does not have any smokeless tobacco history on file. She reports that she does not drink alcohol and does not use drugs.      Objective:     BP 112/74 (BP Location: Left Arm, Patient Position: Sitting, Cuff Size: Normal)   Pulse 67  Gen: No apparent distress, A&O x 3. Pelvic Exam: Normal external female genitalia; Bartholin's and Skene's glands normal in appearance; urethral meatus normal in appearance, no urethral masses or discharge.   Attempted a #3 incontinence dish with support which was uncomfortable due to the bulk and she still had leakage  Attempted a #2 incontinence ring with support that was comfortable, but her prolapse was pushing the pessary and she still had leakage.    Attempted a #3 incontinence ring with support that was comfortable, but her prolapse was pushing the pessary and she still had leakage.    Assessment/Plan:    Assessment: Kathleen Reilly is a 68 y.o. with stage I pelvic organ prolapse and stress incontinence who presents for a  pessary fitting. Plan:  Patient was not fit with a pessary. After multiple incontinence pessaries seemed to worsen incontinence, we discussed it appears that her prolapse has worsened which is contributing to increased leakage. She does have +CST on exam and leaked multiple times during the fittings with cough and valsalva. We discussed that she may be better served with surgical intervention than with pessary or urethral bulking. She reports agreement and will plan to follow up with Dr. Aron Lard for re-examination and surgical planning.   Patient to return to meet with Dr. Aron Lard for surgical planning   Kathleen Mapp G Jencarlo Bonadonna, NP

## 2024-03-26 NOTE — Patient Instructions (Addendum)
 Today we tried different pessaries and you had increased leakage with all pessaries related to your prolapse.   We discussed options of doing urethral bulking or a anterior vaginal wall repair with a surgical sling. We will get you to follow up with Dr. Aron Lard for repeat exam and surgical planning.   Start vaginal estrogen cream nightly for 2 weeks and then do it twice weekly after.

## 2024-05-18 ENCOUNTER — Ambulatory Visit: Admitting: Obstetrics

## 2024-05-18 ENCOUNTER — Other Ambulatory Visit (HOSPITAL_COMMUNITY)
Admission: RE | Admit: 2024-05-18 | Discharge: 2024-05-18 | Disposition: A | Discharge status: Home / Self Care | Source: Other Acute Inpatient Hospital | Attending: Obstetrics | Admitting: Obstetrics

## 2024-05-18 ENCOUNTER — Encounter: Payer: Self-pay | Admitting: Obstetrics

## 2024-05-18 VITALS — BP 124/72 | HR 66

## 2024-05-18 DIAGNOSIS — N8110 Cystocele, unspecified: Secondary | ICD-10-CM | POA: Diagnosis not present

## 2024-05-18 DIAGNOSIS — R350 Frequency of micturition: Secondary | ICD-10-CM | POA: Diagnosis present

## 2024-05-18 DIAGNOSIS — N3946 Mixed incontinence: Secondary | ICD-10-CM | POA: Insufficient documentation

## 2024-05-18 DIAGNOSIS — Z4689 Encounter for fitting and adjustment of other specified devices: Secondary | ICD-10-CM | POA: Diagnosis not present

## 2024-05-18 LAB — POCT URINALYSIS DIP (CLINITEK)
Bilirubin, UA: NEGATIVE
Blood, UA: NEGATIVE
Glucose, UA: NEGATIVE mg/dL
Ketones, POC UA: NEGATIVE mg/dL
Leukocytes, UA: NEGATIVE
Nitrite, UA: NEGATIVE
POC PROTEIN,UA: NEGATIVE
Spec Grav, UA: 1.015 (ref 1.010–1.025)
Urobilinogen, UA: 0.2 U/dL
pH, UA: 6 (ref 5.0–8.0)

## 2024-05-18 NOTE — Assessment & Plan Note (Signed)
-   Failed #3 incontinence dish (discomfort and UUI), #2 incontinence ring with support (UUI), #3 incontinence ring with support (UUI)  - trial of  #2 incontinence ring with support pessary to reassess OAB and prolapse symptoms, known positive CST on prior exam - discussed risk of change in urinary or bowel symptoms, vaginal ulceration, discharge, bleeding, fistula formation. Explained that pt may require multiple sizes and types for fitting.

## 2024-05-18 NOTE — Progress Notes (Signed)
 [facility] Return Visit  SUBJECTIVE  History of Present Illness: Adamariz Gillott is a 68 y.o. female seen in follow-up for stage I pelvic organ prolapse, mixed urinary incontinence, fecal incontinence, urinary frequency, and vaginal atrophy. Plan at last visit was trial of Gemtesa , trial of pessary, and vaginal estrogen.   Urinary frequency symptoms worsened since last year SUI 9-10x/day with activity UUI 1x/month Managed with 1-2 pads/day, especially with Zumba  Denies wearing underwear at night time with episodes of leakage at night  Trial of Gemtesa  with redness on her face 20-33min after medication, took benadryl with resolution.  Using vaginal estrogen 1g twice a week  Previously considered urethral bulking, however concerns regarding UTI risks after review of information.  Failed #3 incontinence dish (discomfort and UUI), #2 incontinence ring with support (UUI), #3 incontinence ring with support (UUI). Reports discomfort with pessary placement   Past Medical History: Patient  has no past medical history on file.   Past Surgical History: She  has a past surgical history that includes Breast enhancement surgery (11/19/1989); Appendectomy (11/20/1963); Ovarian cyst removal (05/19/1981); and Total laparoscopic hysterectomy with salpingectomy.   Medications: She has a current medication list which includes the following prescription(s): bupropion, estradiol , estradiol , ivermectin, magnesium, naltrexone hcl, np thyroid, ondansetron, and bupropion.   Allergies: Patient is allergic to penicillins and gemtesa  [vibegron ].   Social History: Patient  reports that she has never smoked. She does not have any smokeless tobacco history on file. She reports that she does not drink alcohol and does not use drugs.     OBJECTIVE     Physical Exam: Vitals:   05/18/24 1040  BP: 124/72  Pulse: 66   Physical Exam Constitutional:      General: She is not in acute distress.     Appearance: Normal appearance.  Genitourinary:     Bladder and urethral meatus normal.     No lesions in the vagina.     Right Labia: No rash, tenderness, lesions, skin changes or Bartholin's cyst.    Left Labia: No tenderness, lesions, skin changes, Bartholin's cyst or rash.    No vaginal discharge, erythema, tenderness, bleeding, ulceration or granulation tissue.     Anterior vaginal prolapse present.    Mild vaginal atrophy present.     Right Adnexa: not tender, not full and no mass present.    Left Adnexa: not tender, not full and no mass present.    Cervix is absent.     Uterus is absent.     Urethral meatus caruncle not present.    Urethral hypermobility and stress urinary incontinence with cough stress test present.     No urethral prolapse, tenderness, mass or discharge present.   Cardiovascular:     Rate and Rhythm: Normal rate.  Pulmonary:     Effort: Pulmonary effort is normal. No respiratory distress.  Abdominal:     General: Abdomen is flat. There is no distension.     Palpations: There is no mass.     Tenderness: There is no abdominal tenderness.     Hernia: No hernia is present.   Neurological:     Mental Status: She is alert.  Vitals reviewed. Exam conducted with a chaperone present.   POP-Q  0  Aa   0                                           Ba  -6                                              C   2                                            Gh  2                                            Pb  7                                            tvl   -2                                            Ap  -2                                            Bp                                                 D     A size 2 incontinence ring with support pessary was fitted. It was comfortable, stayed in place with valsalva and was an appropriate size on examination, with one finger fitting between the pessary and the vaginal  walls. Positive CST  Lab Results  Component Value Date   COLORU yellow 05/18/2024   CLARITYU clear 05/18/2024   GLUCOSEUR negative 05/18/2024   BILIRUBINUR negative 05/18/2024   KETONESU Negative 08/05/2023   SPECGRAV 1.015 05/18/2024   RBCUR negative 05/18/2024   PHUR 6.0 05/18/2024   PROTEINUR Negative 08/05/2023   UROBILINOGEN 0.2 05/18/2024   LEUKOCYTESUR Negative 05/18/2024       ASSESSMENT AND PLAN    Ms. Sian is a 68 y.o. with:  1. Pelvic organ prolapse quantification stage 2 cystocele   2. Mixed stress and urge urinary incontinence   3. Fitting and adjustment of pessary     Pelvic organ prolapse quantification stage 2 cystocele Assessment & Plan: - For treatment of pelvic organ prolapse, we discussed options for management including expectant management, conservative management, and surgical management, such as Kegels, a pessary, pelvic floor physical therapy, and specific surgical procedures. - Failed #3 incontinence dish (discomfort and UUI), #2 incontinence ring with support (UUI), #3 incontinence ring with support (UUI) - denies bulge symptoms - pt desires  to proceed with trial of #2 incontinence ring with support to assess change in urinary or pelvic symptoms. If OAB symptoms improve, pt desires to consider anterior repair at the time of anti-incontinence procedure - no discomfort with pessary placement with vaginal estrogen use, vaginal estrogen for pessary use   Mixed stress and urge urinary incontinence Assessment & Plan: - stress > urgency with + CST - For treatment of stress urinary incontinence,  reviewed non-surgical options include expectant management, weight loss, physical therapy, as well as a pessary.  Surgical options include a midurethral sling, Burch urethropexy, and transurethral injection of a bulking agent. - discussed office procedure with urethral bulking (Bulkamid). We discussed success rate of approximately 70-80% and possible need for  second injection. We reviewed that this is not a permanent procedure and the Bulkamid does become less effective over time. Risks reviewed including injury to bladder or urethra, UTI, urinary retention and hematuria.  - Sling: The effectiveness of a midurethral vaginal mesh sling is approximately 85%, and thus, there will be times when you may leak urine after surgery, especially if your bladder is full or if you have a strong cough. There is a balance between making the sling tight enough to treat your leakage but not too tight so that you have long-term difficulty emptying your bladder. A mesh sling will not directly treat overactive bladder/urge incontinence and may worsen it.  There is an FDA safety notification on vaginal mesh procedures for prolapse but NOT mesh slings. We have extensive experience and training with mesh placement and we have close postoperative follow up to identify any potential complications from mesh. It is important to realize that this mesh is a permanent implant that cannot be easily removed. There are rare risks of mesh exposure (2-4%), pain with intercourse (0-7%), and infection (<1%). The risk of mesh exposure if more likely in a woman with risks for poor healing (prior radiation, poorly controlled diabetes, or immunocompromised). The risk of new or worsened chronic pain after mesh implant is more common in women with baseline chronic pain and/or poorly controlled anxiety or depression. Approximately 2-4% of patients will experience longer-term post-operative voiding dysfunction that may require surgical revision of the sling. We also reviewed that postoperatively, her stream may not be as strong as before surgery.  - We discussed the symptoms of overactive bladder (OAB), which include urinary urgency, urinary frequency, nocturia, with or without urge incontinence.  While we do not know the exact etiology of OAB, several treatment options exist. We discussed management including  behavioral therapy (decreasing bladder irritants, urge suppression strategies, timed voids, bladder retraining), physical therapy, medication; for refractory cases posterior tibial nerve stimulation, sacral neuromodulation, and intravesical botulinum toxin injection.  For anticholinergic medications, we discussed the potential side effects of anticholinergics including dry eyes, dry mouth, constipation, cognitive impairment and urinary retention. For Beta-3 agonist medication, we discussed the potential side effect of elevated blood pressure which is more likely to occur in individuals with uncontrolled hypertension. - continue vaginal estrogen use - facial redness after Gemtesa  use, added as allergy - encouraged to reassess urinary symptoms with #2 incontinence ring with support pessary placement. Patient considering urethral bulking with botox injection vs. Surgical procedure such as midurethral sling with anterior repair. Reassess at pessary check  Orders: -     POCT URINALYSIS DIP (CLINITEK) -     Urine Culture; Future  Fitting and adjustment of pessary Assessment & Plan: - Failed #3 incontinence dish (discomfort and UUI), #2 incontinence  ring with support (UUI), #3 incontinence ring with support (UUI)  - trial of  #2 incontinence ring with support pessary to reassess OAB and prolapse symptoms, known positive CST on prior exam - discussed risk of change in urinary or bowel symptoms, vaginal ulceration, discharge, bleeding, fistula formation. Explained that pt may require multiple sizes and types for fitting.    Time spent: I spent 48 minutes dedicated to the care of this patient on the date of this encounter to include pre-visit review of records, face-to-face time with the patient discussing stage II pelvic organ prolapse, mixed urinary incontinence, fitting and adjustment of pessary, and post visit documentation and ordering testing.  Lianne ONEIDA Gillis, MD

## 2024-05-18 NOTE — Assessment & Plan Note (Addendum)
-   stress > urgency with + CST - For treatment of stress urinary incontinence,  reviewed non-surgical options include expectant management, weight loss, physical therapy, as well as a pessary.  Surgical options include a midurethral sling, Burch urethropexy, and transurethral injection of a bulking agent. - discussed office procedure with urethral bulking (Bulkamid). We discussed success rate of approximately 70-80% and possible need for second injection. We reviewed that this is not a permanent procedure and the Bulkamid does become less effective over time. Risks reviewed including injury to bladder or urethra, UTI, urinary retention and hematuria.  - Sling: The effectiveness of a midurethral vaginal mesh sling is approximately 85%, and thus, there will be times when you may leak urine after surgery, especially if your bladder is full or if you have a strong cough. There is a balance between making the sling tight enough to treat your leakage but not too tight so that you have long-term difficulty emptying your bladder. A mesh sling will not directly treat overactive bladder/urge incontinence and may worsen it.  There is an FDA safety notification on vaginal mesh procedures for prolapse but NOT mesh slings. We have extensive experience and training with mesh placement and we have close postoperative follow up to identify any potential complications from mesh. It is important to realize that this mesh is a permanent implant that cannot be easily removed. There are rare risks of mesh exposure (2-4%), pain with intercourse (0-7%), and infection (<1%). The risk of mesh exposure if more likely in a woman with risks for poor healing (prior radiation, poorly controlled diabetes, or immunocompromised). The risk of new or worsened chronic pain after mesh implant is more common in women with baseline chronic pain and/or poorly controlled anxiety or depression. Approximately 2-4% of patients will experience longer-term  post-operative voiding dysfunction that may require surgical revision of the sling. We also reviewed that postoperatively, her stream may not be as strong as before surgery.  - We discussed the symptoms of overactive bladder (OAB), which include urinary urgency, urinary frequency, nocturia, with or without urge incontinence.  While we do not know the exact etiology of OAB, several treatment options exist. We discussed management including behavioral therapy (decreasing bladder irritants, urge suppression strategies, timed voids, bladder retraining), physical therapy, medication; for refractory cases posterior tibial nerve stimulation, sacral neuromodulation, and intravesical botulinum toxin injection.  For anticholinergic medications, we discussed the potential side effects of anticholinergics including dry eyes, dry mouth, constipation, cognitive impairment and urinary retention. For Beta-3 agonist medication, we discussed the potential side effect of elevated blood pressure which is more likely to occur in individuals with uncontrolled hypertension. - continue vaginal estrogen use - facial redness after Gemtesa  use, added as allergy - encouraged to reassess urinary symptoms with #2 incontinence ring with support pessary placement. Patient considering urethral bulking with botox injection vs. Surgical procedure such as midurethral sling with anterior repair. Reassess at pessary check

## 2024-05-18 NOTE — Patient Instructions (Signed)
 Continue vaginal estrogen 1g twice a week.   For your #2 incontinence ring with support pessary: - remove if you experience worsening sensation of pelvic pressure or discomfort - monitor for changes in your pelvic pressure and urinary frequency/urgency symptoms to determine if pelvic organ prolapse surgery will resolve your overactive bladder symptoms - discussed risk of change in urinary or bowel symptoms, vaginal ulceration, discharge, bleeding, fistula formation. You may require multiple sizes and types for fitting.   For treatment of pelvic organ prolapse, we discussed options for management including expectant management, conservative management, and surgical management, such as Kegels, a pessary, pelvic floor physical therapy, and specific surgical procedures.  For treatment of stress urinary incontinence,  non-surgical options include expectant management, weight loss, physical therapy, as well as a pessary.  Surgical options include a midurethral sling, Burch urethropexy, and transurethral injection of a bulking agent.  1) We discussed an office procedure with urethral bulking (Bulkamid). We discussed success rate of approximately 70-80% and possible need for second injection. We reviewed that this is not a permanent procedure and the Bulkamid does become less effective over time. Risks reviewed including injury to bladder or urethra, UTI, urinary retention and hematuria.   2) Sling: The effectiveness of a midurethral vaginal mesh sling is approximately 85%, and thus, there will be times when you may leak urine after surgery, especially if your bladder is full or if you have a strong cough. There is a balance between making the sling tight enough to treat your leakage but not too tight so that you have long-term difficulty emptying your bladder. A mesh sling will not directly treat overactive bladder/urge incontinence and may worsen it.  There is an FDA safety notification on vaginal mesh  procedures for prolapse but NOT mesh slings. We have extensive experience and training with mesh placement and we have close postoperative follow up to identify any potential complications from mesh. It is important to realize that this mesh is a permanent implant that cannot be easily removed. There are rare risks of mesh exposure (2-4%), pain with intercourse (0-7%), and infection (<1%). The risk of mesh exposure if more likely in a woman with risks for poor healing (prior radiation, poorly controlled diabetes, or immunocompromised). The risk of new or worsened chronic pain after mesh implant is more common in women with baseline chronic pain and/or poorly controlled anxiety or depression. Approximately 2-4% of patients will experience longer-term post-operative voiding dysfunction that may require surgical revision of the sling. We also reviewed that postoperatively, her stream may not be as strong as before surgery.   We discussed the symptoms of overactive bladder (OAB), which include urinary urgency, urinary frequency, night-time urination, with or without urge incontinence.  We discussed management including behavioral therapy (decreasing bladder irritants by following a bladder diet, urge suppression strategies, timed voids, bladder retraining), physical therapy, medication; and for refractory cases posterior tibial nerve stimulation, sacral neuromodulation, and intravesical botulinum toxin injection.   For refractory OAB we reviewed the procedure for intravesical Botox injection with cystoscopy in the office and reviewed the risks, benefits and alternatives of treatment including but not limited to infection, need for self-catheterization and need for repeat therapy.  We discussed that there is a 5-15% chance of needing to catheterize with Botox and that this usually resolves in a few months; however can persist for longer periods of time.  Typically Botox injections would need to be repeated every 3-12  months since this is not a permanent therapy.  We discussed the role of sacral neuromodulation and how it works. It requires a test phase, and documentation of bladder function via diary. After a successful test period, a permanent wire and generator are placed in the OR. The battery lasts 5 years on average and would need to be replaced surgically.  The goal of this therapy is at least a 50% improvement in symptoms. It is NOT realistic to expect a 100% cure.  We reviewed the fact that about 30% of patients fail the test phase and are not candidates for permanent generator placement.  We discussed the risk of infection and that the patient would not be able to get an MRI once the device is placed. There are two companies that provide this therapy: [employer] and [employer]. [employer]' product is new and is similar to [employer]'s, but has advantages of a smaller and rechargeable battery and being able to have an MRI with the implant. For all procedures, we discussed risks of bleeding, infection, damage to surrounding organs including bowel, bladder, blood vessels, ureters and nerves, need for further surgery, risk of postoperative urinary incontinence or retention with need to catheterize, recurrent prolapse, numbness and weakness at any body site, buttock pain, and the rarer risks of blood clot, heart attack, pneumonia, death.    We also discussed the role of percutaneous tibial nerve stimulation and how it works.  She understands it requires 12 weekly visits for temporary neuromodulation of the sacral nerve roots via the tibial nerve and that she may then require continued tapered treatment.

## 2024-05-18 NOTE — Assessment & Plan Note (Addendum)
-   For treatment of pelvic organ prolapse, we discussed options for management including expectant management, conservative management, and surgical management, such as Kegels, a pessary, pelvic floor physical therapy, and specific surgical procedures. - Failed #3 incontinence dish (discomfort and UUI), #2 incontinence ring with support (UUI), #3 incontinence ring with support (UUI) - denies bulge symptoms - pt desires to proceed with trial of #2 incontinence ring with support to assess change in urinary or pelvic symptoms. If OAB symptoms improve, pt desires to consider anterior repair at the time of anti-incontinence procedure - no discomfort with pessary placement with vaginal estrogen use, vaginal estrogen for pessary use

## 2024-05-19 ENCOUNTER — Ambulatory Visit: Payer: Self-pay | Admitting: Obstetrics

## 2024-05-19 LAB — URINE CULTURE: Culture: <10000 — AB

## 2024-06-04 ENCOUNTER — Ambulatory Visit: Admitting: Obstetrics and Gynecology

## 2024-06-04 ENCOUNTER — Encounter: Payer: Self-pay | Admitting: Obstetrics and Gynecology

## 2024-06-04 VITALS — BP 127/82 | HR 69

## 2024-06-04 DIAGNOSIS — N393 Stress incontinence (female) (male): Secondary | ICD-10-CM | POA: Diagnosis not present

## 2024-06-04 DIAGNOSIS — N8110 Cystocele, unspecified: Secondary | ICD-10-CM | POA: Diagnosis not present

## 2024-06-04 DIAGNOSIS — N3281 Overactive bladder: Secondary | ICD-10-CM

## 2024-06-04 NOTE — Progress Notes (Signed)
 [facility] Return Visit  SUBJECTIVE  History of Present Illness: Kathleen Reilly is a 68 y.o. female seen in follow-up for SUI, Stage II pelvic organ prolapse and OAB. Plan at last visit was trial of pessary to see if this helps her OAB and SUI symptoms.  Patient has a #2 Incontinence ring with support in place and reports the leakage with exercise has been worse. She has been having large amounts of leakage when walking on the beach, doing Zumba, and doing stairs.   Patient reports she and Dr. Guadlupe talked at length about her Prolapse, OAB, and SUI. Patient states her most bothersome symptoms are related to the SUI.      Past Medical History: Patient  has no past medical history on file.   Past Surgical History: She  has a past surgical history that includes Breast enhancement surgery (11/19/1989); Appendectomy (11/20/1963); Ovarian cyst removal (05/19/1981); and Total laparoscopic hysterectomy with salpingectomy.   Medications: She has a current medication list which includes the following prescription(s): bupropion, bupropion, estradiol , estradiol , ivermectin, magnesium, naltrexone hcl, np thyroid, and ondansetron.   Allergies: Patient is allergic to penicillins and gemtesa  [vibegron ].   Social History: Patient  reports that she has never smoked. She does not have any smokeless tobacco history on file. She reports that she does not drink alcohol and does not use drugs.     OBJECTIVE     Physical Exam: Vitals:   06/04/24 0850  BP: 127/82  Pulse: 69   Gen: No apparent distress, A&O x 3.  Detailed Urogynecologic Evaluation:  Vaginal opening normal. Pessary removed at patient request and not re-inserted. No bleeding, mass, or other concerning symptoms.    ASSESSMENT AND PLAN    Ms. Meleski is a 68 y.o. with:  1. Pelvic organ prolapse quantification stage 2 cystocele   2. SUI (stress urinary incontinence, female)   3. OAB (overactive bladder)    Patient reports  her prolapse is not as bothersome to her as the leakage and she is not currently concerned about this. We discussed if she wanted a surgical sling in the future that she would potentially need an anterior repair.  Most bothersome symptom for patient. We discussed that she has tried the least invasive option of the pessary with worsening of leakage. She reports she has considered the information Dr. Guadlupe gave her and would prefer to do Urethral bulking at this time as it is less invasive than surgical sling.  Not as bothersome to patient. Denies significant urgency or frequency. Most bothersome if the leaking with exercise and change in position.   Patient to follow up for urethral bulking with Dr. Guadlupe. If the bulking is not effective patient reports she would like to consider the sling option.    Kathleen Radloff G Jaquelin Meaney, NP

## 2024-08-26 ENCOUNTER — Telehealth: Payer: Self-pay

## 2024-08-26 MED ORDER — CIPROFLOXACIN HCL 500 MG PO TABS: ORAL_TABLET | ORAL | 0 refills | Status: AC

## 2024-08-26 NOTE — Telephone Encounter (Signed)
 Kathleen Reilly is called to provide pre-procedural instructions for her [] Cystoscopy, [] Bladder Botox or [] Urethral Bulking. She is scheduled on 08/27/2024.  The patient is not having worsening urinary symptoms to her urinary patterns. The patient is not experiencing any UTI symptoms. [] The patient is scheduled to come in 2-3 days prior for a non-provider visit to run a POC Urinalysis.  [] The patient cannot come in for a pre-procedural Urinalysis therefore discussed with provider to determine treatment prior to procedure.   [] The patient has been prescribed the pre-procedural antibiotics. The patient is reminded to take the prescribed antibiotics the morning of the procedure, an hour before and the morning of the following day.   [x] The patient has not previously been prescribed the pre-procedural anitbiotics, review with patient her medication allergies to determine the antibiotic prescription needed.  [] Since no allergies, a prescription for Cipro 500mg   #2, take 1 the morning of the procedure and 1 the morning after the procedure and one the morning after the procedure.  [] Since the patient is allergic to Sulfa, the patient will be prescribed Macrobid 100mg  #2, take 1 the morning of the procedure and 1 the morning after the procedure. The patient is reminded to arrive 5 minutes prior to the scheduled appointment time and that a urine sample will need to be collected the upon arrival for the procedure.

## 2024-08-27 ENCOUNTER — Ambulatory Visit: Admitting: Obstetrics

## 2024-08-27 ENCOUNTER — Encounter: Payer: Self-pay | Admitting: Obstetrics

## 2024-08-27 VITALS — BP 131/84 | HR 69

## 2024-08-27 DIAGNOSIS — N3946 Mixed incontinence: Secondary | ICD-10-CM

## 2024-08-27 DIAGNOSIS — N393 Stress incontinence (female) (male): Secondary | ICD-10-CM

## 2024-08-27 LAB — POCT URINALYSIS DIP (CLINITEK)
Bilirubin, UA: NEGATIVE
Blood, UA: NEGATIVE
Glucose, UA: NEGATIVE mg/dL
Ketones, POC UA: NEGATIVE mg/dL
Leukocytes, UA: NEGATIVE
Nitrite, UA: NEGATIVE
POC PROTEIN,UA: NEGATIVE
Spec Grav, UA: 1.025 (ref 1.010–1.025)
Urobilinogen, UA: 0.2 U/dL
pH, UA: 8 — AB (ref 5.0–8.0)

## 2024-08-27 NOTE — Patient Instructions (Signed)

## 2024-08-27 NOTE — Progress Notes (Signed)
 Bulkamid Injection  CC: 68 y.o. y.o. F with stress incontinence who presents for transurethral Bulkamid injection.  SUI 9-10x/day with activity UUI 1x/month Managed with 1-2 pads/day, especially with Zumba  Trial of Gemtesa  with redness on her face 20-67min after medication, took benadryl with resolution.  Using vaginal estrogen 1g twice a week Failed #3 incontinence dish (discomfort and UUI), #2 incontinence ring with support (UUI), #3 incontinence ring with support (UUI). Reports discomfort with pessary placement   Patient signed her consent form.  She started antibiotic prophylaxis today with Ciprofloxacin.  Today's Vitals   08/27/24 0921  BP: 131/84  Pulse: 69    Results for orders placed or performed in visit on 08/27/24 (from the past 24 hours)  POCT URINALYSIS DIP (CLINITEK)     Status: Abnormal   Collection Time: 08/27/24 12:59 PM  Result Value Ref Range   Color, UA yellow yellow   Clarity, UA clear clear   Glucose, UA negative negative mg/dL   Bilirubin, UA negative negative   Ketones, POC UA negative negative mg/dL   Spec Grav, UA 8.974 8.989 - 1.025   Blood, UA negative negative   pH, UA 8.0 (A) 5.0 - 8.0   POC PROTEIN,UA negative negative, trace   Urobilinogen, UA 0.2 0.2 or 1.0 E.U./dL   Nitrite, UA Negative Negative   Leukocytes, UA Negative Negative    Procedure: Time out was performed. The bladder was catheterized and 10 ml of 2% lidocaine jelly placed in the urethra. A urethral block was performed by injecting 3ml of 1% lidocaine with epinephrine at 3 and 9 o'clock adjacent to the urethra.  The needle was primed.  The cystoscope was inserted to the level of the bladder neck.  The needle was inserted 2 cm and the scope was pulled back into the urethra 2 cm.  The needle was inserted bevel up at the 5 o'clock position and the Bulkamid was injected to obtain coaptation.  This was repeated at the 2 o'clock,  10 o'clock and 7 o'clock positions.   A total of 2- 1ml  syringes were used and good circumferential coaptation was noted.  The patient tolerated the procedure well. She was asked to void after the procedure.  Voided total of   Post Void Residual - 08/27/24 1210       Post Void Residual   Post Void Residual 184 mL         ASSESSMENT: 68 y.o. y.o. s/p transurethral Bulkamid injection for stress incontinence.   - patient advised to return after 1-2 hours or sooner if she experiences discomfort or leakage to reassess PVR. Reviewed signs and symptoms of urinary retention.  PLAN: Patient will follow up in 4 weeks to reassess. Voiding and post-procedure precautions were given. She will return for heavy bleeding, fevers, dysuria lasting beyond today and incomplete emptying.  All questions were answered.  Lianne ONEIDA Gillis, MD

## 2024-09-25 ENCOUNTER — Ambulatory Visit: Admitting: Obstetrics

## 2024-09-25 ENCOUNTER — Encounter: Payer: Self-pay | Admitting: Obstetrics

## 2024-09-25 VITALS — BP 135/77 | HR 68

## 2024-09-25 DIAGNOSIS — N952 Postmenopausal atrophic vaginitis: Secondary | ICD-10-CM

## 2024-09-25 NOTE — Progress Notes (Signed)
 [facility] Return Visit  SUBJECTIVE  History of Present Illness: Kathleen Reilly is a 68 y.o. female seen in follow-up for stage I pelvic organ prolapse, mixed urinary incontinence, fecal incontinence, urinary frequency, and vaginal atrophy. Plan at last visit was urethral bulking on 08/27/24 and vaginal estrogen.   Reports 100% improvement without SUI or UUI leakage. Now able to pickup her grand daughter without leakage Discontinued pad use Now focuses on pelvic floor relaxation for bladder emptying Continues vaginal estrogen 1g 2x/week Denies UTI symptoms Voids 1x/night   Prior visit: Urinary frequency symptoms worsened since last year SUI 9-10x/day with activity UUI 1x/month Managed with 1-2 pads/day, especially with Zumba  Denies wearing underwear at night time with episodes of leakage at night  Trial of Gemtesa  with redness on her face 20-110min after medication, took benadryl with resolution.  Using vaginal estrogen 1g twice a week  Failed #3 incontinence dish (discomfort and UUI), #2 incontinence ring with support (UUI), #3 incontinence ring with support (UUI). Reports discomfort with pessary placement   Past Medical History: Patient  has no past medical history on file.   Past Surgical History: She  has a past surgical history that includes Breast enhancement surgery (11/19/1989); Appendectomy (11/20/1963); Ovarian cyst removal (05/19/1981); and Total laparoscopic hysterectomy with salpingectomy.   Medications: She has a current medication list which includes the following prescription(s): ciprofloxacin, estradiol , estradiol , ivermectin, magnesium, naltrexone hcl, np thyroid, and ondansetron.   Allergies: Patient is allergic to penicillins and gemtesa  [vibegron ].   Social History: Patient  reports that she has never smoked. She does not have any smokeless tobacco history on file. She reports that she does not drink alcohol and does not use drugs.     OBJECTIVE      Physical Exam: Vitals:   09/25/24 0947  BP: 135/77  Pulse: 68     Post Void Residual - 09/25/24 1000       Post Void Residual   Post Void Residual 28 mL             ASSESSMENT AND PLAN    Kathleen Reilly is a 68 y.o. with:  1. Vaginal atrophy      Vaginal atrophy Assessment & Plan: - For symptomatic vaginal atrophy options include lubrication with a water-based lubricant, personal hygiene measures and barrier protection against wetness, and estrogen replacement in the form of vaginal cream, vaginal tablets, or a time-released vaginal ring.   - continue low dose vaginal estrogen 1g twice a week    Time spent: I spent 17 minutes dedicated to the care of this patient on the date of this encounter to include pre-visit review of records, face-to-face time with the patient discussing stage II pelvic organ prolapse, mixed urinary incontinence, vaginal atrophy, and post visit documentation and ordering testing.  Lianne ONEIDA Gillis, MD

## 2024-09-25 NOTE — Assessment & Plan Note (Signed)
-   For symptomatic vaginal atrophy options include lubrication with a water-based lubricant, personal hygiene measures and barrier protection against wetness, and estrogen replacement in the form of vaginal cream, vaginal tablets, or a time-released vaginal ring.   - continue low dose vaginal estrogen 1g twice a week.

## 2024-09-25 NOTE — Patient Instructions (Signed)
 Continue vaginal estrogen 1g twice a week.   Please call if you experience any change in urinary or vaginal symptoms.

## 2024-11-30 ENCOUNTER — Encounter: Payer: Self-pay | Admitting: *Deleted
